# Patient Record
Sex: Male | Born: 1995 | Race: Black or African American | Hispanic: No | Marital: Single | Smoking: Never smoker
Health system: Southern US, Community
[De-identification: ages and names within clinical notes are randomized; demographics above are authoritative.]

## PROBLEM LIST (undated history)

## (undated) DIAGNOSIS — J45909 Unspecified asthma, uncomplicated: Secondary | ICD-10-CM

---

## 2013-11-24 ENCOUNTER — Ambulatory Visit (HOSPITAL_COMMUNITY): Payer: Managed Care, Other (non HMO) | Attending: Emergency Medicine

## 2013-11-24 ENCOUNTER — Emergency Department (INDEPENDENT_AMBULATORY_CARE_PROVIDER_SITE_OTHER)
Admission: EM | Admit: 2013-11-24 | Discharge: 2013-11-24 | Disposition: A | Discharge status: Home / Self Care | Payer: Managed Care, Other (non HMO) | Source: Home / Self Care | Attending: Family Medicine | Admitting: Family Medicine

## 2013-11-24 ENCOUNTER — Encounter (HOSPITAL_COMMUNITY): Payer: Self-pay | Admitting: Emergency Medicine

## 2013-11-24 DIAGNOSIS — R509 Fever, unspecified: Secondary | ICD-10-CM | POA: Diagnosis not present

## 2013-11-24 DIAGNOSIS — J45909 Unspecified asthma, uncomplicated: Secondary | ICD-10-CM

## 2013-11-24 DIAGNOSIS — R079 Chest pain, unspecified: Secondary | ICD-10-CM

## 2013-11-24 DIAGNOSIS — R0602 Shortness of breath: Secondary | ICD-10-CM | POA: Insufficient documentation

## 2013-11-24 DIAGNOSIS — R0600 Dyspnea, unspecified: Secondary | ICD-10-CM

## 2013-11-24 DIAGNOSIS — R05 Cough: Secondary | ICD-10-CM | POA: Insufficient documentation

## 2013-11-24 DIAGNOSIS — R059 Cough, unspecified: Secondary | ICD-10-CM

## 2013-11-24 HISTORY — DX: Unspecified asthma, uncomplicated: J45.909

## 2013-11-24 MED ORDER — BENZONATATE 100 MG PO CAPS: 100.0000 mg | ORAL_CAPSULE | Freq: Three times a day (TID) | ORAL | Status: AC | PRN

## 2013-11-24 MED ORDER — ALBUTEROL SULFATE HFA 108 (90 BASE) MCG/ACT IN AERS
1.0000 | INHALATION_SPRAY | Freq: Four times a day (QID) | RESPIRATORY_TRACT | Status: AC | PRN
Start: 2013-11-24 — End: ?

## 2013-11-24 MED ORDER — PREDNISONE 10 MG PO TABS: ORAL_TABLET | ORAL | Status: AC

## 2013-11-24 NOTE — ED Notes (Signed)
Radiology advised of need for xray

## 2013-11-24 NOTE — Discharge Instructions (Signed)
Please stop smoking. You do not have pneumonia and you do not need antibiotics. Please use medications as prescribed for wheezing and cough. Your symptoms will improve over the next 7-10 days. If they do not, please follow up. Your chest xray was normal.  Bronchospasm A bronchospasm is a spasm or tightening of the airways going into the lungs. During a bronchospasm breathing becomes more difficult because the airways get smaller. When this happens there can be coughing, a whistling sound when breathing (wheezing), and difficulty breathing. Bronchospasm is often associated with asthma, but not all patients who experience a bronchospasm have asthma. CAUSES  A bronchospasm is caused by inflammation or irritation of the airways. The inflammation or irritation may be triggered by:   Allergies (such as to animals, pollen, food, or mold). Allergens that cause bronchospasm may cause wheezing immediately after exposure or many hours later.   Infection. Viral infections are believed to be the most common cause of bronchospasm.   Exercise.   Irritants (such as pollution, cigarette smoke, strong odors, aerosol sprays, and paint fumes).   Weather changes. Winds increase molds and pollens in the air. Rain refreshes the air by washing irritants out. Cold air may cause inflammation.   Stress and emotional upset.  SIGNS AND SYMPTOMS   Wheezing.   Excessive nighttime coughing.   Frequent or severe coughing with a simple cold.   Chest tightness.   Shortness of breath.  DIAGNOSIS  Bronchospasm is usually diagnosed through a history and physical exam. Tests, such as chest X-rays, are sometimes done to look for other conditions. TREATMENT   Inhaled medicines can be given to open up your airways and help you breathe. The medicines can be given using either an inhaler or a nebulizer machine.  Corticosteroid medicines may be given for severe bronchospasm, usually when it is associated with  asthma. HOME CARE INSTRUCTIONS   Always have a plan prepared for seeking medical care. Know when to call your health care provider and local emergency services (911 in the [place] Know where you can access local emergency care.  Only take medicines as directed by your health care provider.  If you were prescribed an inhaler or nebulizer machine, ask your health care provider to explain how to use it correctly. Always use a spacer with your inhaler if you were given one.  It is necessary to remain calm during an attack. Try to relax and breathe more slowly.  Control your home environment in the following ways:   Change your heating and air conditioning filter at least once a month.   Limit your use of fireplaces and wood stoves.  Do not smoke and do not allow smoking in your home.   Avoid exposure to perfumes and fragrances.   Get rid of pests (such as roaches and mice) and their droppings.   Throw away plants if you see mold on them.   Keep your house clean and dust free.   Replace carpet with wood, tile, or vinyl flooring. Carpet can trap dander and dust.   Use allergy-proof pillows, mattress covers, and box spring covers.   Wash bed sheets and blankets every week in hot water and dry them in a dryer.   Use blankets that are made of polyester or cotton.   Wash hands frequently. SEEK MEDICAL CARE IF:   You have muscle aches.   You have chest pain.   The sputum changes from clear or white to yellow, green, gray, or bloody.   The  sputum you cough up gets thicker.   There are problems that may be related to the medicine you are given, such as a rash, itching, swelling, or trouble breathing.  SEEK IMMEDIATE MEDICAL CARE IF:   You have worsening wheezing and coughing even after taking your prescribed medicines.   You have increased difficulty breathing.   You develop severe chest pain. MAKE SURE YOU:   Understand these instructions.  Will watch  your condition.  Will get help right away if you are not doing well or get worse. Document Released: 01/19/2003 Document Revised: 01/21/2013 Document Reviewed: 07/08/2012 [facility] Patient Information 2015 [place], [place]. This information is not intended to replace advice given to you by your health care provider. Make sure you discuss any questions you have with your health care provider.

## 2013-11-24 NOTE — ED Notes (Signed)
Was called by front office staff to assess patient w CC : chest pain w cough. NAD, having cough x 1 week, productive sounding cough

## 2013-11-24 NOTE — ED Notes (Signed)
Cough x 1 week, hurts to breathe; NAD

## 2013-11-24 NOTE — ED Provider Notes (Signed)
CSN: [id number]     Arrival date & time 11/24/13  1310 History   First MD Initiated Contact with Patient 11/24/13 1419     Chief Complaint  Patient presents with  . Cough   (Consider location/radiation/quality/duration/timing/severity/associated sxs/prior Treatment) Patient is a 18 y.o. male presenting with cough. The history is provided by the patient.  Cough Cough characteristics:  Productive Sputum characteristics:  Yellow Severity:  Moderate Onset quality:  Gradual Duration:  1 week Progression:  Unchanged Chronicity:  New Smoker: yes   Associated symptoms: chest pain, chills, diaphoresis, fever, shortness of breath and wheezing   Associated symptoms: no ear fullness, no ear pain, no eye discharge, no headaches, no myalgias, no rash, no rhinorrhea, no sinus congestion, no sore throat and no weight loss     Past Medical History  Diagnosis Date  . Asthma    History reviewed. No pertinent past surgical history. History reviewed. No pertinent family history. History  Substance Use Topics  . Smoking status: Never Smoker   . Smokeless tobacco: Not on file  . Alcohol Use: Not on file    Review of Systems  Constitutional: Positive for fever, chills and diaphoresis. Negative for weight loss.  HENT: Positive for congestion. Negative for ear pain, rhinorrhea and sore throat.   Eyes: Negative for discharge.  Respiratory: Positive for cough, shortness of breath and wheezing.   Cardiovascular: Positive for chest pain.  Gastrointestinal: Negative.   Musculoskeletal: Negative for myalgias.  Skin: Negative for rash.  Neurological: Negative for headaches.    Allergies  Review of patient's allergies indicates no known allergies.  Home Medications   Prior to Admission medications   Medication Sig Start Date End Date Taking? Authorizing Provider  albuterol (PROVENTIL HFA;VENTOLIN HFA) 108 (90 BASE) MCG/ACT inhaler Inhale 1-2 puffs into the lungs every 6 (six) hours as needed for  wheezing or shortness of breath. 11/24/13   Mathis FareJennifer Lee H Presson, PA  benzonatate (TESSALON) 100 MG capsule Take 1 capsule (100 mg total) by mouth 3 (three) times daily as needed for cough. 11/24/13   Mathis FareJennifer Lee H Presson, PA  predniSONE (DELTASONE) 10 MG tablet Take 4 po QD day 1, 3 tabs po QD day 2, 2 tabs po QD day 3, 1 tab po QD day 4 then stop. 11/24/13   Jess BartersJennifer Lee H Presson, PA   BP 124/84  Pulse 80  Temp(Src) 97.9 F (36.6 C) (Oral)  Resp 14  SpO2 97% Physical Exam  Nursing note and vitals reviewed. Constitutional: He is oriented to person, place, and time. He appears well-developed and well-nourished. No distress.  HENT:  Head: Normocephalic and atraumatic.  Right Ear: Hearing, tympanic membrane, external ear and ear canal normal.  Left Ear: Hearing, tympanic membrane, external ear and ear canal normal.  Nose: Nose normal.  Mouth/Throat: Uvula is midline, oropharynx is clear and moist and mucous membranes are normal.  Eyes: Conjunctivae are normal. Right eye exhibits no discharge. Left eye exhibits no discharge. No scleral icterus.  Cardiovascular: Normal rate, regular rhythm and normal heart sounds.   Pulmonary/Chest: Effort normal. No respiratory distress. He has wheezes.  +moderate end-expiratory wheezing  Musculoskeletal: Normal range of motion.  Neurological: He is alert and oriented to person, place, and time.  Skin: Skin is warm and dry. No rash noted. No erythema.  Psychiatric: He has a normal mood and affect. His behavior is normal.    ED Course  Procedures (including critical care time) Labs Review Labs Reviewed - No data to  display  Imaging Review Dg Chest 2 View  11/24/2013   CLINICAL DATA:  Cough and fever.  Chest pain and short of breath  EXAM: CHEST  2 VIEW  COMPARISON:  None.  FINDINGS: The heart size and mediastinal contours are within normal limits. Both lungs are clear. The visualized skeletal structures are unremarkable.  IMPRESSION: No active  cardiopulmonary disease.   Electronically Signed   By: Marlan Palauharles  Clark M.D.   On: 11/24/2013 15:37     MDM   1. Cough   2. Fever   3. Dyspnea   4. Chest pain   5. Asthmatic bronchitis, unspecified asthma severity, uncomplicated   CXR unremarkable Will treat for asthmatic bronchitis. Patient encouraged to stop smoking. Will treat with prednisone taper, bronchodilators and cough suppressant. Follow up if no improvement.    Ria ClockJennifer Lee H Presson, [place] 11/24/13 1622

## 2013-11-24 NOTE — ED Provider Notes (Signed)
Medical screening examination/treatment/procedure(s) were performed by resident physician or non-physician practitioner and as supervising physician I was immediately available for consultation/collaboration.   Barkley BrunsKINDL,Carson Meche DOUGLAS MD.   Linna HoffJames D Khyla Mccumbers, MD 11/24/13 [id number]

## 2018-06-12 ENCOUNTER — Emergency Department (HOSPITAL_COMMUNITY)
Admission: EM | Admit: 2018-06-12 | Discharge: 2018-06-12 | Disposition: A | Discharge status: Home / Self Care | Payer: BLUE CROSS/BLUE SHIELD | Attending: Emergency Medicine | Admitting: Emergency Medicine

## 2018-06-12 ENCOUNTER — Emergency Department (HOSPITAL_COMMUNITY): Payer: BLUE CROSS/BLUE SHIELD

## 2018-06-12 ENCOUNTER — Other Ambulatory Visit: Payer: Self-pay

## 2018-06-12 DIAGNOSIS — Z23 Encounter for immunization: Secondary | ICD-10-CM | POA: Diagnosis not present

## 2018-06-12 DIAGNOSIS — M79672 Pain in left foot: Secondary | ICD-10-CM | POA: Diagnosis present

## 2018-06-12 MED ORDER — TETANUS-DIPHTH-ACELL PERTUSSIS 5-2.5-18.5 LF-MCG/0.5 IM SUSP
0.5000 mL | Freq: Once | INTRAMUSCULAR | Status: AC
  Administered 2018-06-12: 16:00:00 0.5 mL via INTRAMUSCULAR
  Filled 2018-06-12: qty 0.5

## 2018-06-12 MED ORDER — HYDROCODONE-ACETAMINOPHEN 5-325 MG PO TABS
1.0000 | ORAL_TABLET | Freq: Once | ORAL | Status: AC
  Administered 2018-06-12: 16:00:00 1 via ORAL
  Filled 2018-06-12: qty 1

## 2018-06-12 NOTE — Discharge Instructions (Addendum)
You were evaluated in the Emergency Department and after careful evaluation, we did not find any emergent condition requiring admission or further testing in the hospital.  Your symptoms today seem to be due to a sprain of the foot.  As discussed, please practice rest, ice, elevation, compression of this foot for the next 2 days.  Use Tylenol or ibuprofen during the day for pain.  Please return to the Emergency Department if you experience any worsening of your condition.  We encourage you to follow up with a primary care provider.  Thank you for allowing [place] to be a part of your care.

## 2018-06-12 NOTE — ED Triage Notes (Signed)
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 Redness, swelling, and pain noted. Unable to palpate pulse, will reassess with doppler, normal capillary refill noted.

## 2018-06-12 NOTE — ED Provider Notes (Signed)
[facility] Emergency Department Provider Note MRN:  [id number]  Arrival date & time: 06/12/18     Chief Complaint   Foot Swelling and Foot Pain   History of Present Illness   Dustin Evans is a 23 y.o. year-old male with no pertinent past medical history presenting to the ED with chief complaint of foot pain.  Patient was at work and his left foot became caught between 2 forklifts causing a crush injury.  This occurred 4 hours to arrival here in the emergency department.  Trouble bearing weight since that time, pain increasing.  Pain is now moderate, worse with motion or palpation.  Denies fall, no head trauma, no other injuries.  Review of Systems  A complete 10 system review of systems was obtained and all systems are negative except as noted in the HPI and PMH.   Patient's Health History    Past Medical History:  Diagnosis Date  . Asthma     No past surgical history on file.  No family history on file.  Social History   Socioeconomic History  . Marital status: Single    Spouse name: Not on file  . Number of children: Not on file  . Years of education: Not on file  . Highest education level: Not on file  Occupational History  . Not on file  Social Needs  . Financial resource strain: Not on file  . Food insecurity:    Worry: Not on file    Inability: Not on file  . Transportation needs:    Medical: Not on file    Non-medical: Not on file  Tobacco Use  . Smoking status: Never Smoker  Substance and Sexual Activity  . Alcohol use: Not on file  . Drug use: Yes    Types: Marijuana  . Sexual activity: Not on file  Lifestyle  . Physical activity:    Days per week: Not on file    Minutes per session: Not on file  . Stress: Not on file  Relationships  . Social connections:    Talks on phone: Not on file    Gets together: Not on file    Attends religious service: Not on file    Active member of club or organization: Not on file    Attends  meetings of clubs or organizations: Not on file    Relationship status: Not on file  . Intimate partner violence:    Fear of current or ex partner: Not on file    Emotionally abused: Not on file    Physically abused: Not on file    Forced sexual activity: Not on file  Other Topics Concern  . Not on file  Social History Narrative  . Not on file     Physical Exam  Vital Signs and Nursing Notes reviewed Vitals:   06/12/18 1557  BP: (!) 141/85  Resp: 15  Temp: 98.6 F (37 C)  SpO2: 100%    CONSTITUTIONAL: Well-appearing, NAD NEURO:  Alert and oriented x 3, no focal deficits EYES:  eyes equal and reactive ENT/NECK:  no LAD, no JVD CARDIO: Regular rate, well-perfused, normal S1 and S2 PULM:  CTAB no wheezing or rhonchi GI/GU:  normal bowel sounds, non-distended, non-tender MSK/SPINE:  No gross deformities, moderate edema to the left midfoot and medial malleolus, moderate tenderness to palpation SKIN:  no rash, superficial abrasion to the left heel PSYCH:  Appropriate speech and behavior  Diagnostic and Interventional Summary    Labs Reviewed -  No data to display  DG Foot Complete Left  Final Result    DG Ankle Complete Left  Final Result    DG Tibia/Fibula Left  Final Result      Medications  HYDROcodone-acetaminophen (NORCO/VICODIN) 5-325 MG per tablet 1 tablet (1 tablet Oral Given 06/12/18 1616)  Tdap (BOOSTRIX) injection 0.5 mL (0.5 mLs Intramuscular Given 06/12/18 1617)     Procedures Critical Care  ED Course and Medical Decision Making  I have reviewed the triage vital signs and the nursing notes.  Pertinent labs & imaging results that were available during my care of the patient were reviewed by me and considered in my medical decision making (see below for details).  Foot is neurovascularly intact, no other injuries, will update tetanus, provide pain control, x-rays to exclude fracture.  X-rays are normal.  Foot is without laxity, little to no concern for  Lisfranc injury.  Patient advised to follow-up with a specialist if pain not significantly improved in 2 weeks.  After the discussed management above, the patient was determined to be safe for discharge.  The patient was in agreement with this plan and all questions regarding their care were answered.  ED return precautions were discussed and the patient will return to the ED with any significant worsening of condition.  Elmer SowMichael M. Pilar PlateBero, MD [facility] [email or web address]  Final Clinical Impressions(s) / ED Diagnoses     ICD-10-CM   1. Foot pain, left M79.672     ED Discharge Orders    None         Sabas SousBero, Hadia Minier M, MD 06/12/18 1747

## 2019-06-22 ENCOUNTER — Other Ambulatory Visit: Payer: Self-pay

## 2019-06-22 ENCOUNTER — Encounter (HOSPITAL_COMMUNITY): Payer: Self-pay | Admitting: Emergency Medicine

## 2019-06-22 ENCOUNTER — Emergency Department (HOSPITAL_COMMUNITY)
Admission: EM | Admit: 2019-06-22 | Discharge: 2019-06-22 | Disposition: A | Discharge status: Home / Self Care | Payer: Self-pay | Attending: Emergency Medicine | Admitting: Emergency Medicine

## 2019-06-22 DIAGNOSIS — K529 Noninfective gastroenteritis and colitis, unspecified: Secondary | ICD-10-CM | POA: Insufficient documentation

## 2019-06-22 LAB — COMPREHENSIVE METABOLIC PANEL
ALT: 30 U/L (ref 0–44)
AST: 30 U/L (ref 15–41)
Albumin: 4.4 g/dL (ref 3.5–5.0)
Alkaline Phosphatase: 80 U/L (ref 38–126)
Anion gap: 10 (ref 5–15)
BUN: 17 mg/dL (ref 6–20)
CO2: 27 mmol/L (ref 22–32)
Calcium: 9.6 mg/dL (ref 8.9–10.3)
Chloride: 104 mmol/L (ref 98–111)
Creatinine, Ser: 1.3 mg/dL — ABNORMAL HIGH (ref 0.61–1.24)
GFR calc Af Amer: >60 mL/min (ref >60)
GFR calc non Af Amer: >60 mL/min (ref >60)
Glucose, Bld: 151 mg/dL — ABNORMAL HIGH (ref 70–99)
Potassium: 4.7 mmol/L (ref 3.5–5.1)
Sodium: 141 mmol/L (ref 135–145)
Total Bilirubin: 1.3 mg/dL — ABNORMAL HIGH (ref 0.3–1.2)
Total Protein: 7.3 g/dL (ref 6.5–8.1)

## 2019-06-22 LAB — CBC
HCT: 54.9 % — ABNORMAL HIGH (ref 39.0–52.0)
Hemoglobin: 18.2 g/dL — ABNORMAL HIGH (ref 13.0–17.0)
MCH: 31.1 pg (ref 26.0–34.0)
MCHC: 33.2 g/dL (ref 30.0–36.0)
MCV: 93.8 fL (ref 80.0–100.0)
Platelets: 163 10*3/uL (ref 150–400)
RBC: 5.85 MIL/uL — ABNORMAL HIGH (ref 4.22–5.81)
RDW: 14.1 % (ref 11.5–15.5)
WBC: 7.4 10*3/uL (ref 4.0–10.5)
nRBC: 0 % (ref 0.0–0.2)

## 2019-06-22 LAB — LIPASE, BLOOD: Lipase: 23 U/L (ref 11–51)

## 2019-06-22 MED ORDER — ONDANSETRON 8 MG PO TBDP: ORAL_TABLET | ORAL | 0 refills | Status: AC

## 2019-06-22 MED ORDER — ONDANSETRON HCL 4 MG/2ML IJ SOLN
4.0000 mg | Freq: Once | INTRAMUSCULAR | Status: AC
  Administered 2019-06-22: 4 mg via INTRAVENOUS
  Filled 2019-06-22: qty 2

## 2019-06-22 MED ORDER — KETOROLAC TROMETHAMINE 30 MG/ML IJ SOLN
30.0000 mg | Freq: Once | INTRAMUSCULAR | Status: AC
  Administered 2019-06-22: 30 mg via INTRAVENOUS
  Filled 2019-06-22: qty 1

## 2019-06-22 MED ORDER — SODIUM CHLORIDE 0.9% FLUSH: 3.0000 mL | Freq: Once | INTRAVENOUS | Status: DC

## 2019-06-22 MED ORDER — SODIUM CHLORIDE 0.9 % IV BOLUS
1000.0000 mL | Freq: Once | INTRAVENOUS | Status: AC
  Administered 2019-06-22: 1000 mL via INTRAVENOUS

## 2019-06-22 NOTE — ED Triage Notes (Signed)
C/o generalized abd pain, nausea, vomiting, and diarrhea that started yesterday 1 hour after eating [employer].

## 2019-06-22 NOTE — ED Provider Notes (Signed)
[facility] EMERGENCY DEPARTMENT Provider Note   CSN: [id number] Arrival date & time: 06/22/19  1053     History Chief Complaint  Patient presents with  . Abdominal Pain    Dustin Evans is a 24 y.o. male.  Patient is a 24 year old male with no significant PMH presenting with complaints of n/v/d that started acutely yesterday evening after eating at [facility].  No fevers or bloody stool.  He reports generalized abdominal cramping with vomiting, but no persistent localized pain.  No urinary complaints.  He denies any ill contacts or others who became ill eating the same food.  The history is provided by the patient.  Abdominal Pain Pain location:  Generalized Pain quality: cramping   Pain radiates to:  Does not radiate Pain severity:  Moderate Onset quality:  Sudden Duration:  12 hours Timing:  Intermittent Progression:  Unchanged Chronicity:  New Relieved by:  Nothing Worsened by:  Nothing      Past Medical History:  Diagnosis Date  . Asthma     There are no problems to display for this patient.   History reviewed. No pertinent surgical history.     No family history on file.  Social History   Tobacco Use  . Smoking status: Never Smoker  . Smokeless tobacco: Never Used  Substance Use Topics  . Alcohol use: Not on file  . Drug use: Yes    Types: Marijuana    Home Medications Prior to Admission medications   Medication Sig Start Date End Date Taking? Authorizing Provider  albuterol (PROVENTIL HFA;VENTOLIN HFA) 108 (90 BASE) MCG/ACT inhaler Inhale 1-2 puffs into the lungs every 6 (six) hours as needed for wheezing or shortness of breath. Patient not taking: Reported on 06/12/2018 11/24/13   Ria Clock, PA  benzonatate (TESSALON) 100 MG capsule Take 1 capsule (100 mg total) by mouth 3 (three) times daily as needed for cough. Patient not taking: Reported on 06/12/2018 11/24/13   Ria Clock, PA  predniSONE  (DELTASONE) 10 MG tablet Take 4 po QD day 1, 3 tabs po QD day 2, 2 tabs po QD day 3, 1 tab po QD day 4 then stop. Patient not taking: Reported on 06/12/2018 11/24/13   Ria Clock, PA    Allergies    Patient has no known allergies.  Review of Systems   Review of Systems  Gastrointestinal: Positive for abdominal pain.  All other systems reviewed and are negative.   Physical Exam Updated Vital Signs BP 125/85   Pulse (!) 101   Temp 98 F (36.7 C) (Oral)   Resp 15   Ht 5\' 10"  (1.778 m)   Wt 97.5 kg   SpO2 99%   BMI 30.85 kg/m   Physical Exam Vitals and nursing note reviewed.  Constitutional:      General: He is not in acute distress.    Appearance: He is well-developed. He is not diaphoretic.  HENT:     Head: Normocephalic and atraumatic.  Cardiovascular:     Rate and Rhythm: Normal rate and regular rhythm.     Heart sounds: No murmur. No friction rub.  Pulmonary:     Effort: Pulmonary effort is normal. No respiratory distress.     Breath sounds: Normal breath sounds. No wheezing or rales.  Abdominal:     General: Bowel sounds are normal. There is no distension.     Palpations: Abdomen is soft.     Tenderness: There  is no abdominal tenderness. There is no right CVA tenderness, left CVA tenderness, guarding or rebound.  Musculoskeletal:        General: Normal range of motion.     Cervical back: Normal range of motion and neck supple.  Skin:    General: Skin is warm and dry.  Neurological:     Mental Status: He is alert and oriented to person, place, and time.     Coordination: Coordination normal.     ED Results / Procedures / Treatments   Labs (all labs ordered are listed, but only abnormal results are displayed) Labs Reviewed  CBC - Abnormal; Notable for the following components:      Result Value   RBC 5.85 (*)    Hemoglobin 18.2 (*)    HCT 54.9 (*)    All other components within normal limits  LIPASE, BLOOD  COMPREHENSIVE METABOLIC PANEL    URINALYSIS, ROUTINE W REFLEX MICROSCOPIC    EKG None  Radiology No results found.  Procedures Procedures (including critical care time)  Medications Ordered in ED Medications  sodium chloride flush (NS) 0.9 % injection 3 mL (3 mLs Intravenous Not Given 06/22/19 1149)  sodium chloride 0.9 % bolus 1,000 mL (1,000 mLs Intravenous New Bag/Given 06/22/19 1145)  ketorolac (TORADOL) 30 MG/ML injection 30 mg (30 mg Intravenous Given 06/22/19 1143)  ondansetron (ZOFRAN) injection 4 mg (4 mg Intravenous Given 06/22/19 1143)    ED Course  I have reviewed the triage vital signs and the nursing notes.  Pertinent labs & imaging results that were available during my care of the patient were reviewed by me and considered in my medical decision making (see chart for details).    MDM Rules/Calculators/A&P  Patient presenting here with nausea, vomiting, and diarrhea that appears either viral or foodborne in nature.  Either way I suspect is self-limited.  Patient is feeling significantly improved after IV fluids, Toradol, and Zofran.  His abdomen is benign and I feel as though discharge is appropriate.  I highly doubt appendicitis or other emergent pathology.  He will be discharged with Zofran and as needed return.  Final Clinical Impression(s) / ED Diagnoses Final diagnoses:  None    Rx / DC Orders ED Discharge Orders    None       Veryl Speak, MD 06/22/19 1352

## 2019-06-22 NOTE — Discharge Instructions (Addendum)
Begin taking Zofran as prescribed as needed for nausea.  Clear liquid diet for the next 12 hours, then slowly advance to normal as tolerated.  Return to the emergency department if you develop worsening pain, high fever, bloody stool, or other new and concerning symptoms.

## 2020-07-21 IMAGING — CR LEFT ANKLE COMPLETE - 3+ VIEW
3 series · 3 of 3 positions shown · non-contrast
Comparison: None.

CLINICAL DATA: Crush injury left foot

EXAM:
LEFT ANKLE COMPLETE - 3+ VIEW

[x ankle lat left]
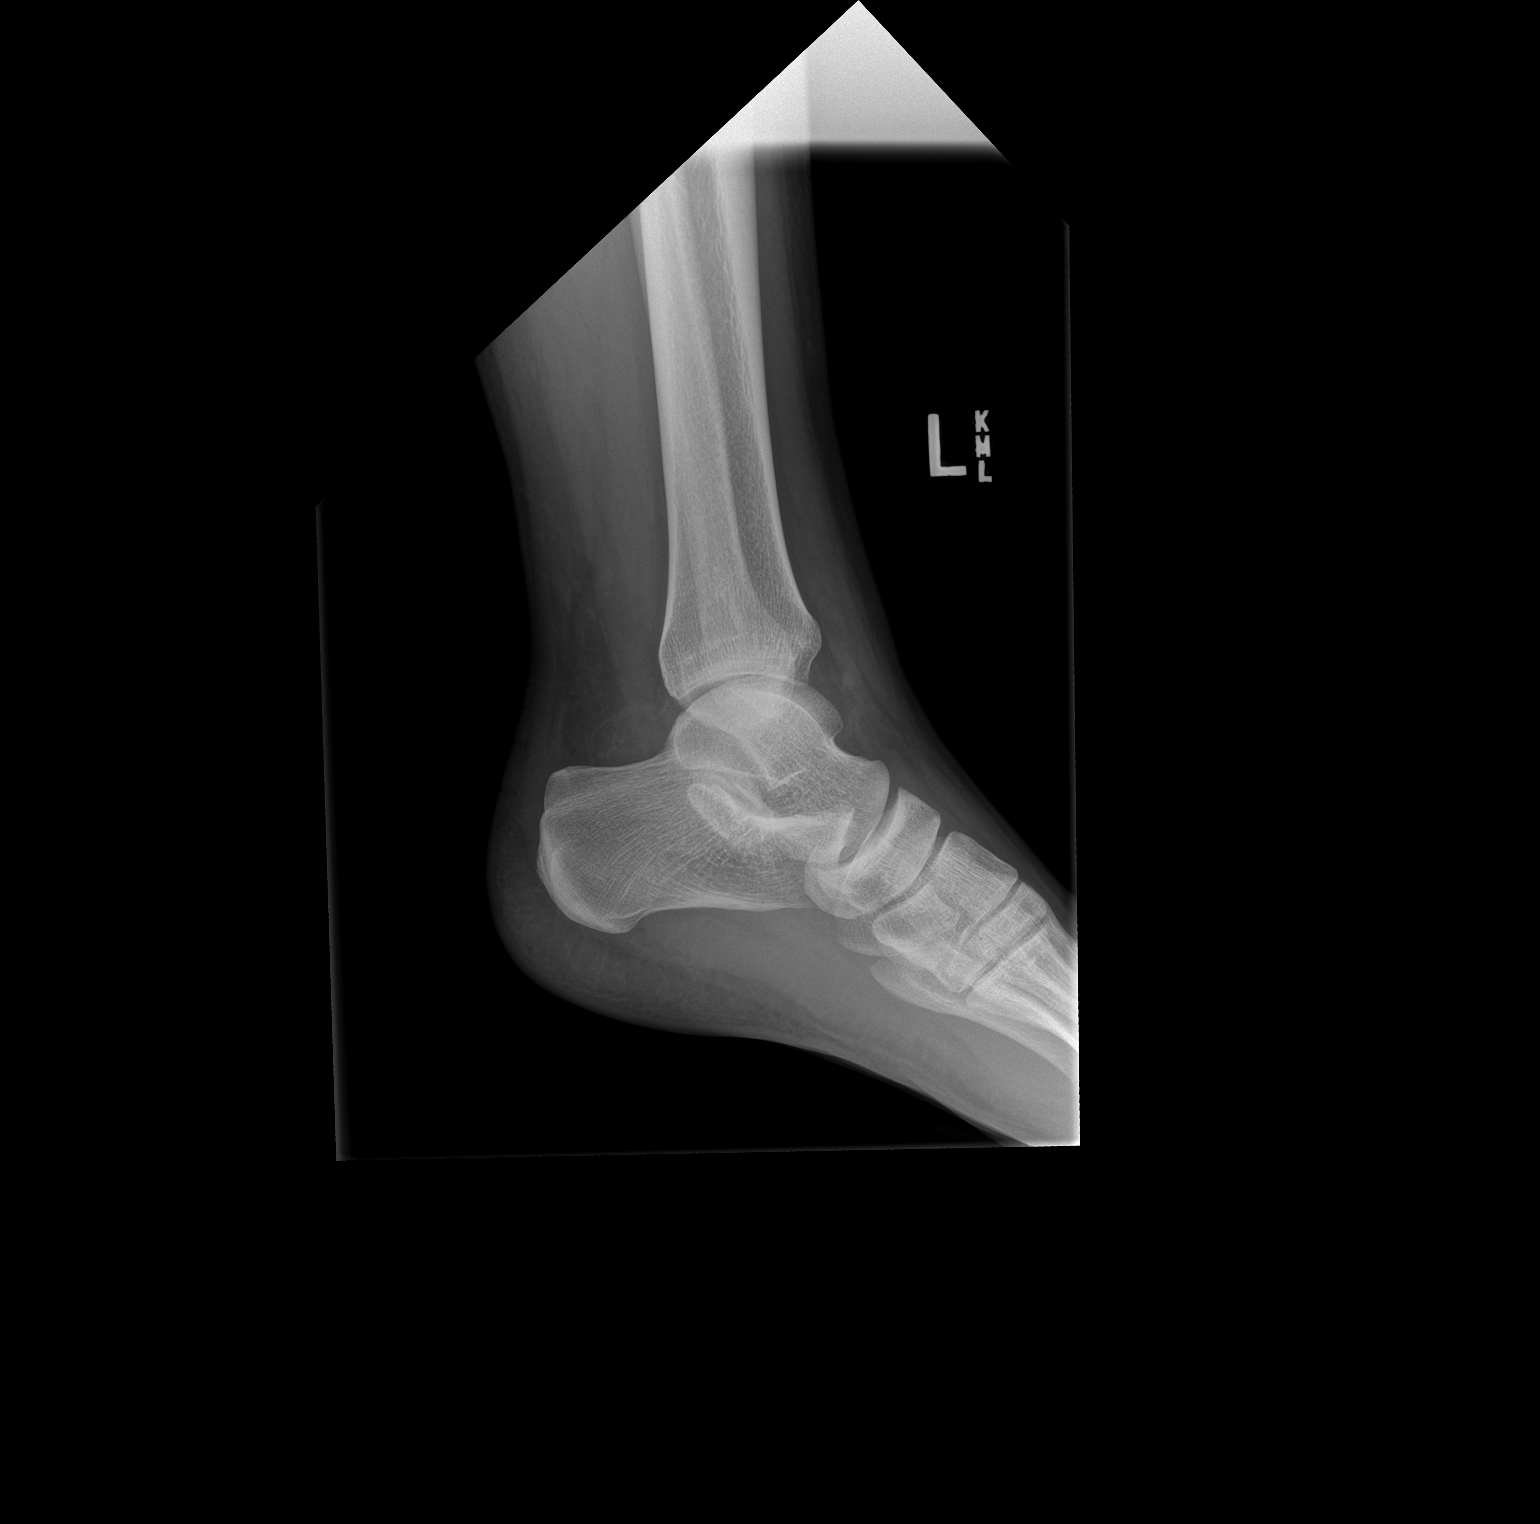

[x ankle ap left]
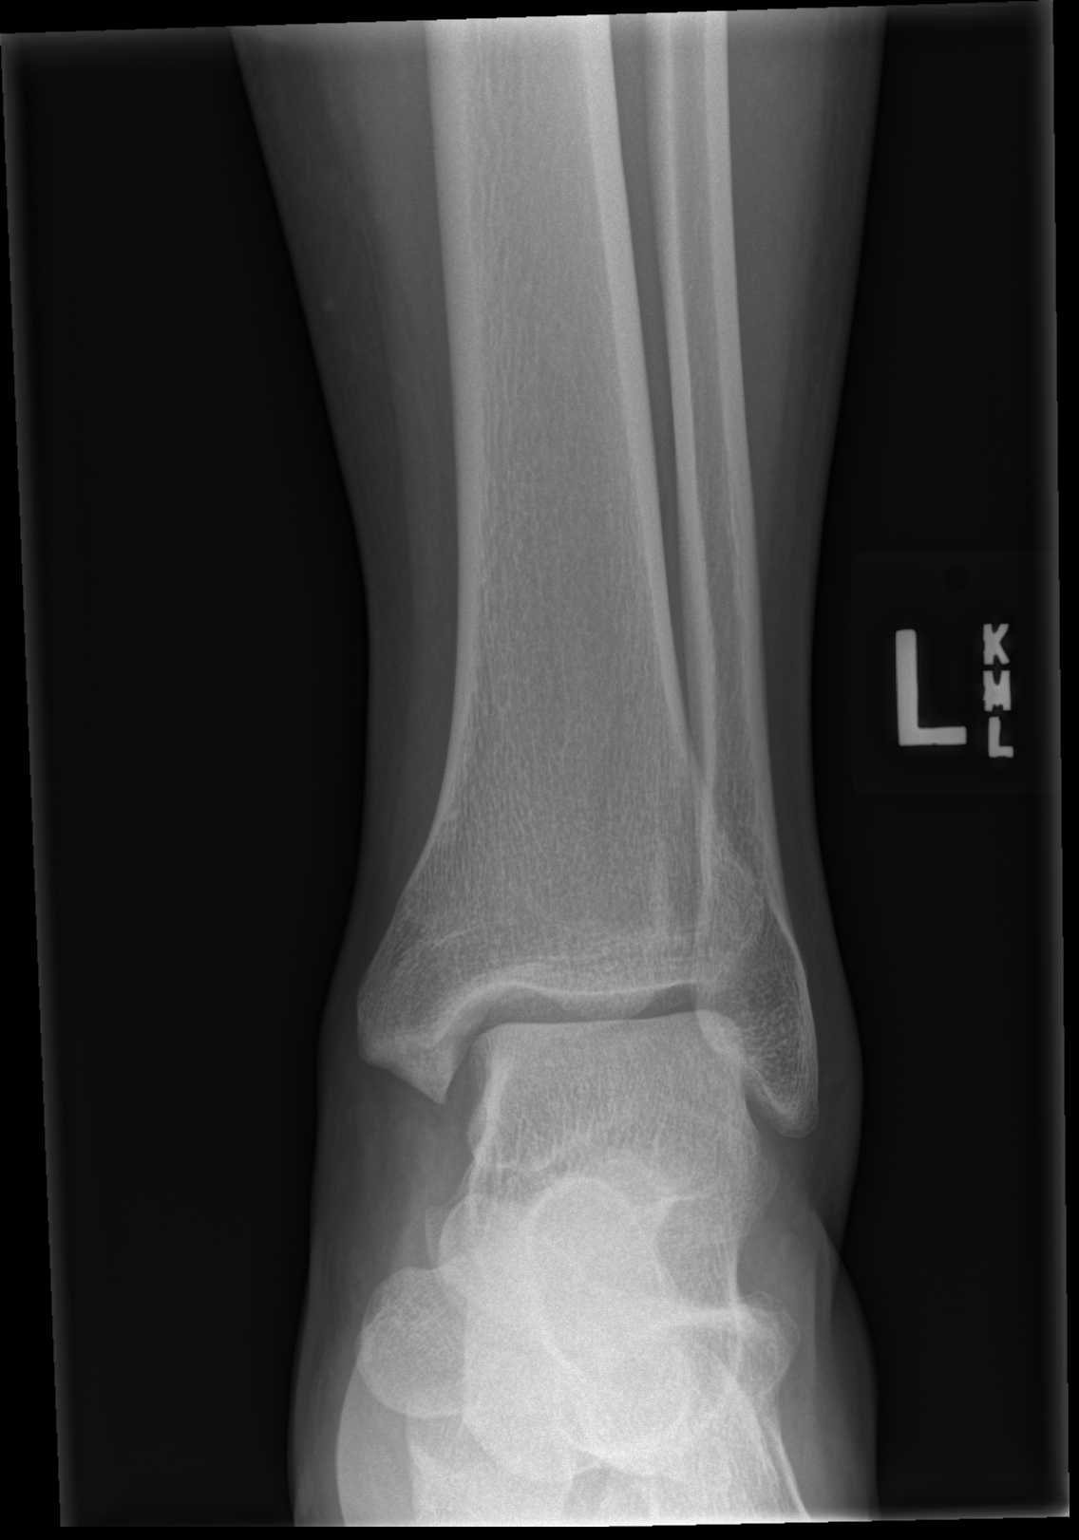

[x ankle obl left]
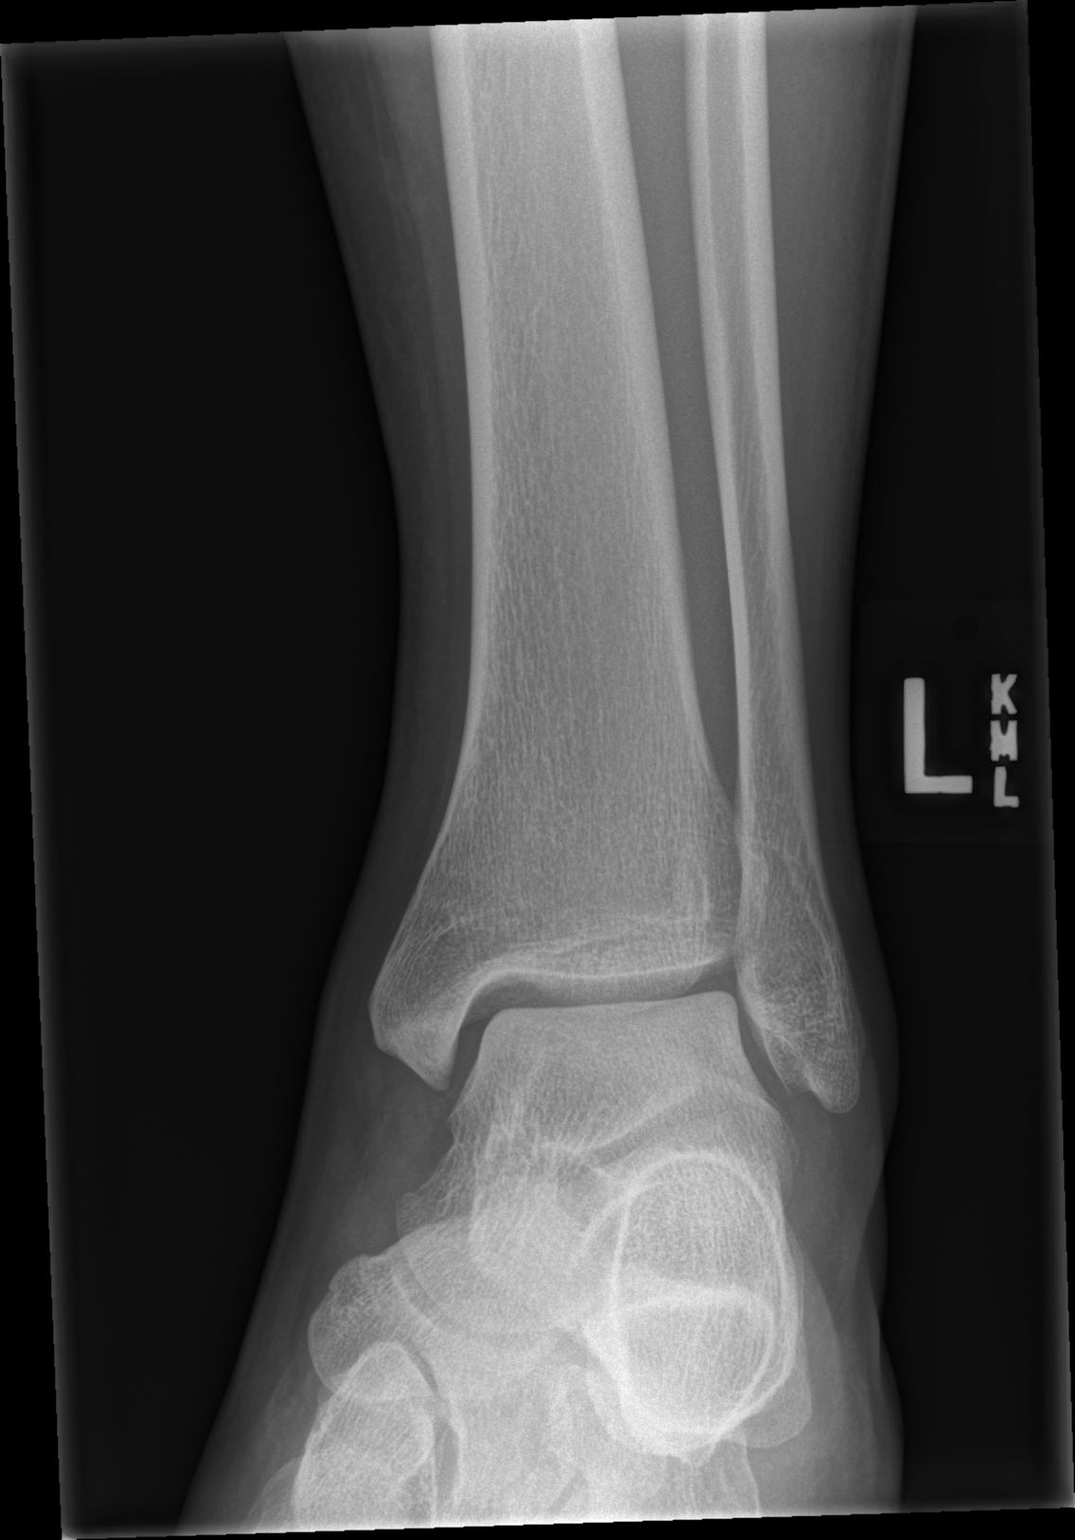

[3 of 3 positions shown; findings below may reference images not displayed]

FINDINGS: There is no evidence of fracture, dislocation, or joint effusion.
There is no evidence of arthropathy or other focal bone abnormality.
Soft tissues are unremarkable.
IMPRESSION: Negative.
